# Patient Record
Sex: Female | Born: 1990 | Race: Black or African American | Hispanic: No | Marital: Single | Smoking: Never smoker
Health system: Southern US, Community
[De-identification: ages and names within clinical notes are randomized; demographics above are authoritative.]

---

## 2021-07-29 ENCOUNTER — Emergency Department (HOSPITAL_COMMUNITY)
Admission: EM | Admit: 2021-07-29 | Discharge: 2021-07-30 | Discharge status: Against Medical Advice | Payer: BC Managed Care – PPO | Attending: Emergency Medicine | Admitting: Emergency Medicine

## 2021-07-29 ENCOUNTER — Emergency Department (HOSPITAL_COMMUNITY): Payer: BC Managed Care – PPO

## 2021-07-29 ENCOUNTER — Encounter (HOSPITAL_COMMUNITY): Payer: Self-pay

## 2021-07-29 ENCOUNTER — Other Ambulatory Visit: Payer: Self-pay

## 2021-07-29 DIAGNOSIS — H579 Unspecified disorder of eye and adnexa: Secondary | ICD-10-CM | POA: Insufficient documentation

## 2021-07-29 DIAGNOSIS — Z5321 Procedure and treatment not carried out due to patient leaving prior to being seen by health care provider: Secondary | ICD-10-CM | POA: Diagnosis not present

## 2021-07-29 LAB — BASIC METABOLIC PANEL
Anion gap: 8 (ref 5–15)
BUN: 7 mg/dL (ref 6–20)
CO2: 23 mmol/L (ref 22–32)
Calcium: 9 mg/dL (ref 8.9–10.3)
Chloride: 105 mmol/L (ref 98–111)
Creatinine, Ser: 0.7 mg/dL (ref 0.44–1.00)
GFR, Estimated: >60 mL/min (ref >60)
Glucose, Bld: 88 mg/dL (ref 70–99)
Potassium: 3.3 mmol/L — ABNORMAL LOW (ref 3.5–5.1)
Sodium: 136 mmol/L (ref 135–145)

## 2021-07-29 LAB — CBC WITH DIFFERENTIAL/PLATELET
Abs Immature Granulocytes: 0.02 10*3/uL (ref 0.00–0.07)
Basophils Absolute: 0 10*3/uL (ref 0.0–0.1)
Basophils Relative: 1 %
Eosinophils Absolute: 0.3 10*3/uL (ref 0.0–0.5)
Eosinophils Relative: 5 %
HCT: 30.8 % — ABNORMAL LOW (ref 36.0–46.0)
Hemoglobin: 9 g/dL — ABNORMAL LOW (ref 12.0–15.0)
Immature Granulocytes: 0 %
Lymphocytes Relative: 25 %
Lymphs Abs: 1.5 10*3/uL (ref 0.7–4.0)
MCH: 22.7 pg — ABNORMAL LOW (ref 26.0–34.0)
MCHC: 29.2 g/dL — ABNORMAL LOW (ref 30.0–36.0)
MCV: 77.6 fL — ABNORMAL LOW (ref 80.0–100.0)
Monocytes Absolute: 0.6 10*3/uL (ref 0.1–1.0)
Monocytes Relative: 11 %
Neutro Abs: 3.4 10*3/uL (ref 1.7–7.7)
Neutrophils Relative %: 58 %
Platelets: 368 10*3/uL (ref 150–400)
RBC: 3.97 MIL/uL (ref 3.87–5.11)
RDW: 19.8 % — ABNORMAL HIGH (ref 11.5–15.5)
WBC: 5.9 10*3/uL (ref 4.0–10.5)
nRBC: 0 % (ref 0.0–0.2)

## 2021-07-29 IMAGING — MR MR HEAD WO/W CM
13 series · 48 of 48 positions shown · IV contrast (gadavist)
Comparison: None.

CLINICAL DATA: Ocular pain

EXAM:
MRI HEAD WITHOUT AND WITH CONTRAST
TECHNIQUE: Multiplanar, multiecho pulse sequences of the brain and surrounding
structures were obtained without and with intravenous contrast.
CONTRAST:  9mL GADAVIST GADOBUTROL 1 MMOL/ML IV SOLN

[Series 5: DWI · axial · 3.0mm · 1.36mm/px · z∈[-72,+71]mm · 7 of 100 slices shown (1 of 2)]
[im 1/100]
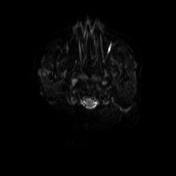
[im 17/100]
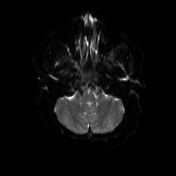
[im 34/100]
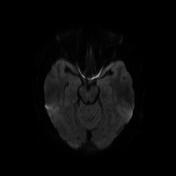
[im 50/100]
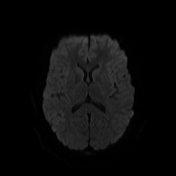
[im 67/100]
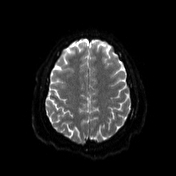
[im 83/100]
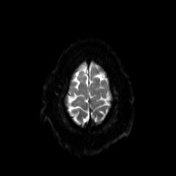
[im 100/100]
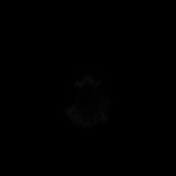

[Series 6: DWI · axial · 3.0mm · 1.36mm/px · z∈[-72,+71]mm · 3 of 50 slices shown (2 of 2)]
[im 1/50]
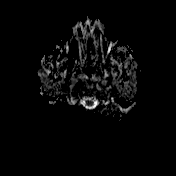
[im 25/50]
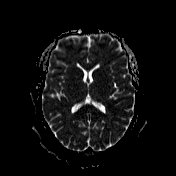
[im 50/50]
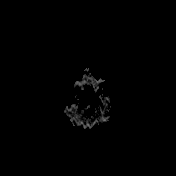

[Series 7: T1 · sagittal · 5.0mm · 0.75mm/px · 2 of 24 slices shown (1 of 2)]
[im 1/24]
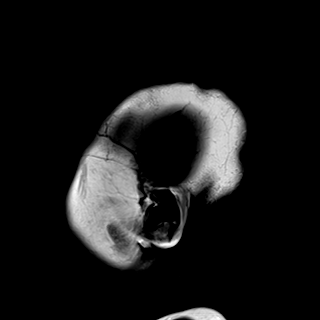
[im 24/24]
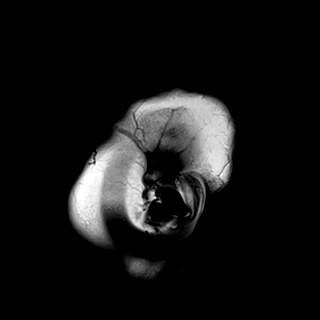

[Series 8: T2 · axial · 5.0mm · 0.62mm/px · 1 of 23 slices shown]
[im 1/23]
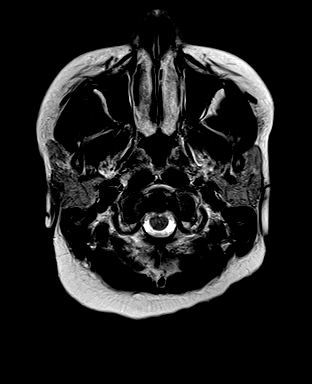

[Series 9: swi_images · axial · 3.0mm · 0.75mm/px · z∈[-69,+68]mm · 3 of 48 slices shown]
[im 1/48]
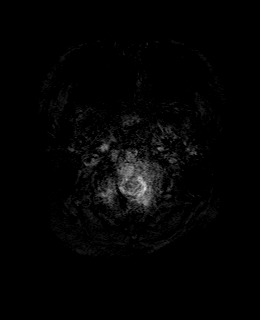
[im 24/48]
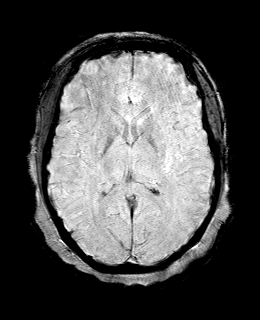
[im 48/48]
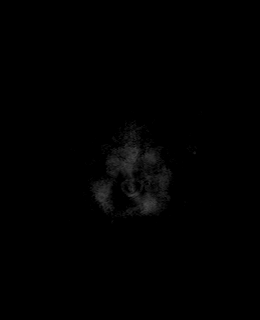

[Series 11: FLAIR · axial · 3.0mm · 0.75mm/px · z∈[-71,+70]mm · 3 of 49 slices shown]
[im 1/49]
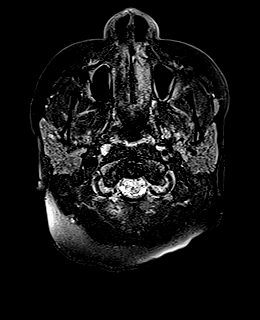
[im 25/49]
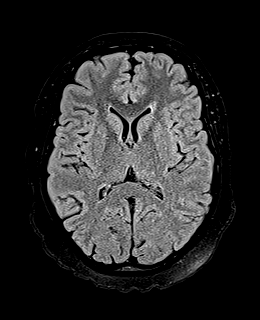
[im 49/49]
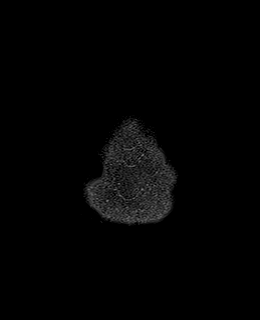

[Series 12: T1 · axial · 1.0mm · 0.94mm/px · z∈[-71,+68]mm · 9 of 144 slices shown (2 of 2)]
[im 1/144]
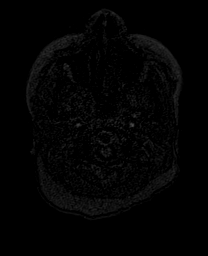
[im 18/144]
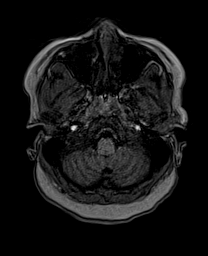
[im 36/144]
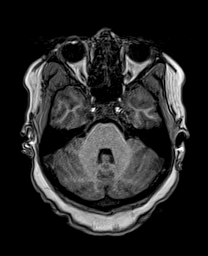
[im 54/144]
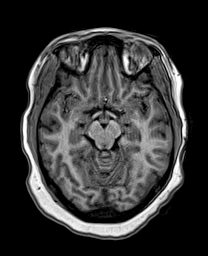
[im 72/144]
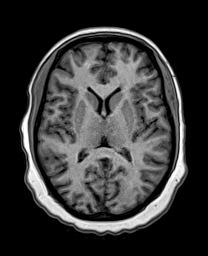
[im 90/144]
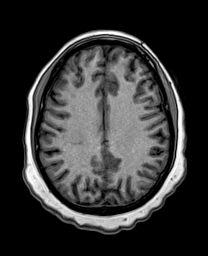
[im 108/144]
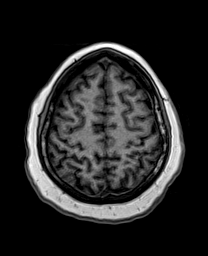
[im 126/144]
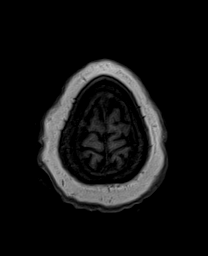
[im 144/144]
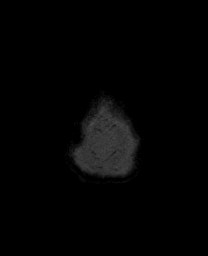

[Series 13: cor dwi_tracew · coronal · 5.0mm · 1.53mm/px · 3 of 54 slices shown]
[im 1/54]
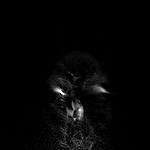
[im 27/54]
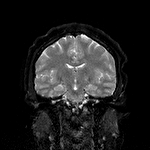
[im 54/54]
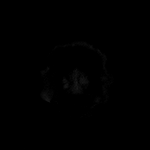

[Series 14: cor dwi_adc · coronal · 5.0mm · 1.53mm/px · 2 of 27 slices shown]
[im 1/27]
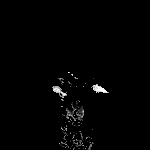
[im 27/27]
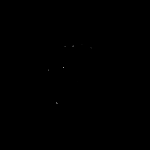

[Series 15: T2 post-contrast · coronal · 5.0mm · 0.69mm/px · 2 of 27 slices shown]
[im 1/27]
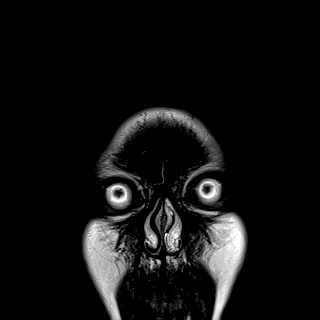
[im 27/27]
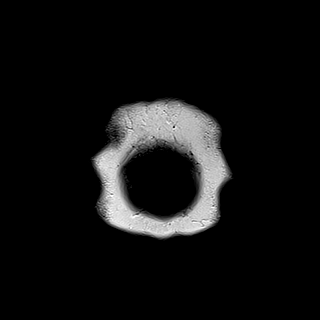

[Series 16: T1 post-contrast · axial · 1.0mm · 0.94mm/px · z∈[-71,+68]mm · 9 of 144 slices shown (1 of 3)]
[im 1/144]
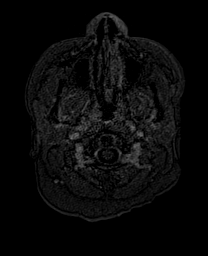
[im 18/144]
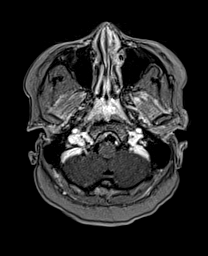
[im 36/144]
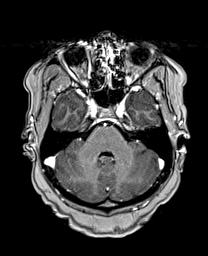
[im 54/144]
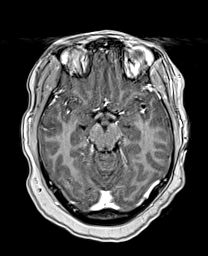
[im 72/144]
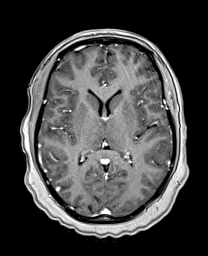
[im 90/144]
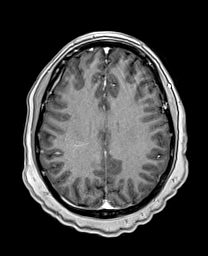
[im 108/144]
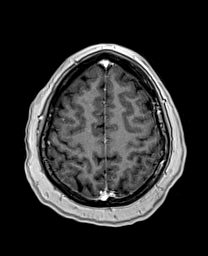
[im 126/144]
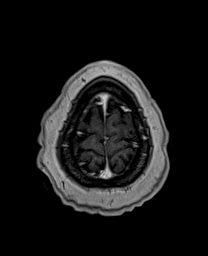
[im 144/144]
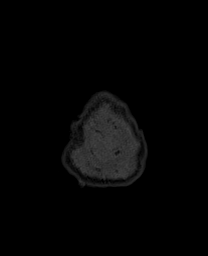

[Series 17: T1 post-contrast · coronal · 5.0mm · 0.43mm/px · 2 of 27 slices shown (2 of 3)]
[im 1/27]
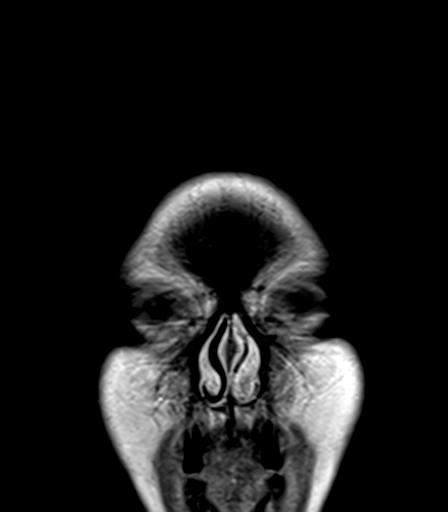
[im 27/27]
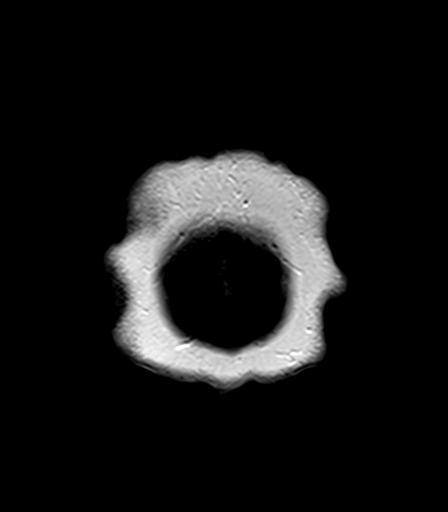

[Series 18: T1 post-contrast · sagittal · 5.0mm · 0.75mm/px · 2 of 24 slices shown (3 of 3)]
[im 1/24]
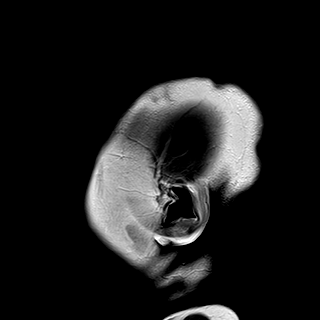
[im 24/24]
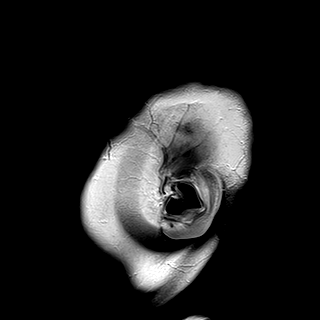

[48 of 48 positions shown; findings below may reference images not displayed]

FINDINGS: Brain: No acute infarct, mass effect or extra-axial collection. No
acute or chronic hemorrhage. Normal white matter signal, parenchymal
volume and CSF spaces. The midline structures are normal. There is
no abnormal contrast enhancement.

Vascular: Major flow voids are preserved.

Skull and upper cervical spine: Normal calvarium and skull base.
Visualized upper cervical spine and soft tissues are normal.

Sinuses/Orbits:No paranasal sinus fluid levels or advanced mucosal
thickening. No mastoid or middle ear effusion. Normal orbits.
IMPRESSION: Normal brain MRI.

## 2021-07-29 IMAGING — MR MR MRV HEAD WO/W CM
2 of 4 series · 30 of 48 positions shown · IV contrast (gadavist)
Comparison: No pertinent prior exam.

CLINICAL DATA: Optic disc edema

EXAM:
MR VENOGRAM HEAD WITHOUT AND WITH CONTRAST
TECHNIQUE: Angiographic images of the intracranial venous structures were
acquired using MRV technique without and with intravenous contrast.
CONTRAST:  9mL GADAVIST GADOBUTROL 1 MMOL/ML IV SOLN

[Series 4: cor 2d · coronal · 2.5mm · 0.69mm/px · 12 of 106 slices shown]
[im 1/106]
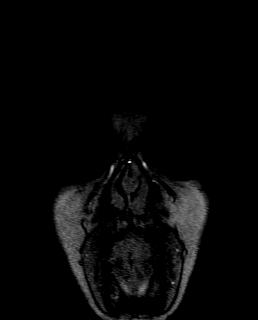
[im 10/106]
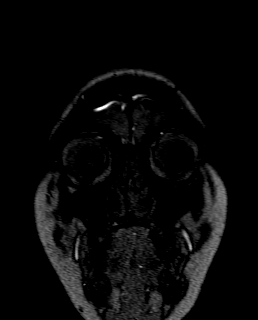
[im 20/106]
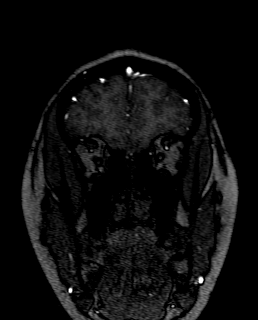
[im 29/106]
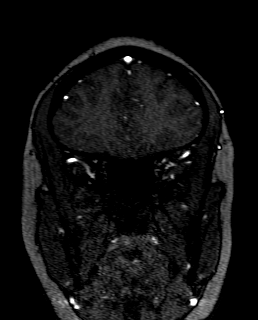
[im 39/106]
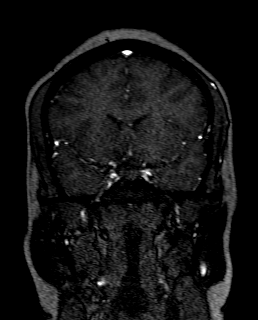
[im 48/106]
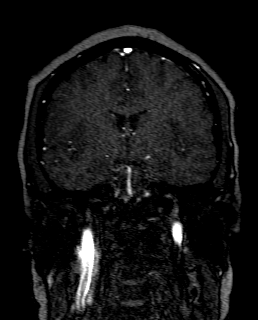
[im 58/106]
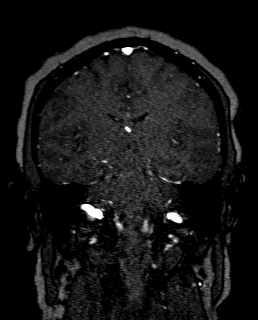
[im 67/106]
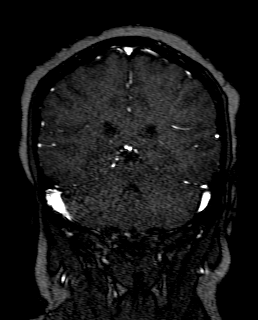
[im 77/106]
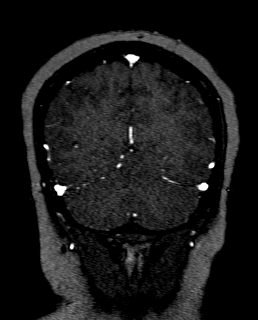
[im 86/106]
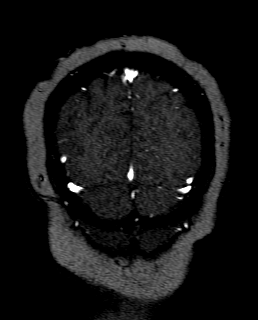
[im 96/106]
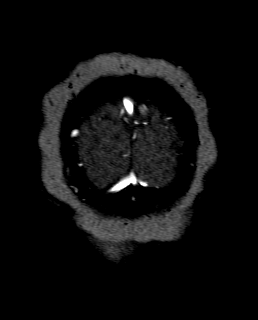
[im 106/106]
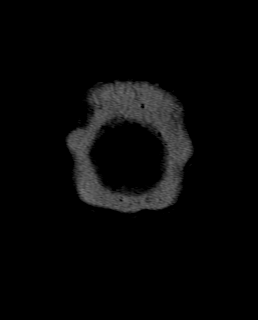

[Series 11: T1 post-contrast · sagittal · 1.0mm · 0.94mm/px · 18 of 144 slices shown]
[im 1/144]
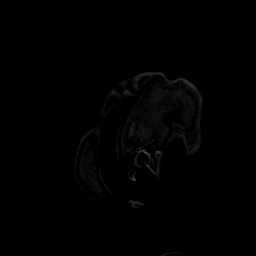
[im 9/144]
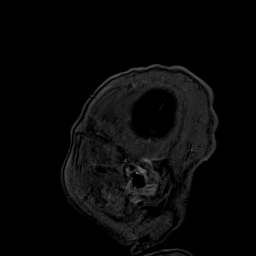
[im 17/144]
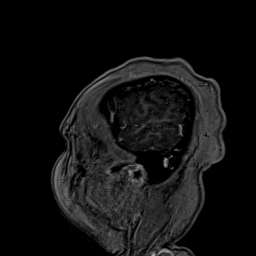
[im 26/144]
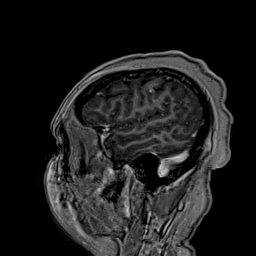
[im 34/144]
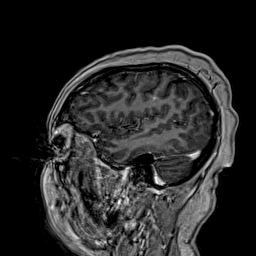
[im 43/144]
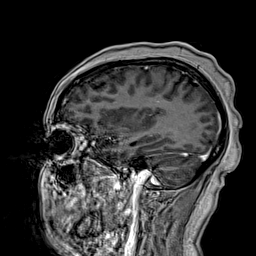
[im 51/144]
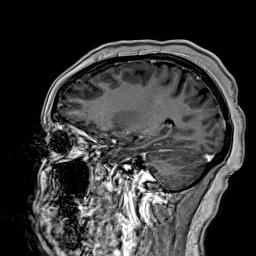
[im 59/144]
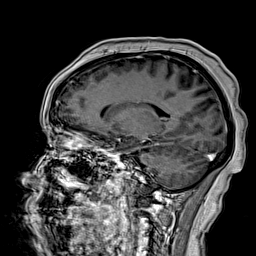
[im 68/144]
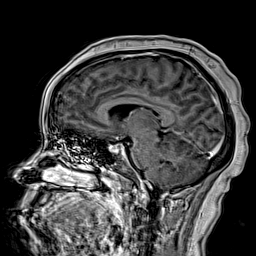
[im 76/144]
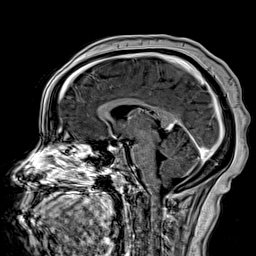
[im 85/144]
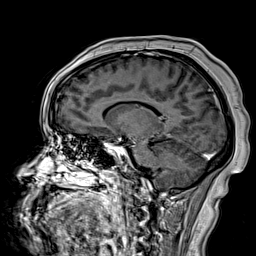
[im 93/144]
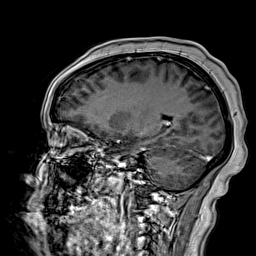
[im 101/144]
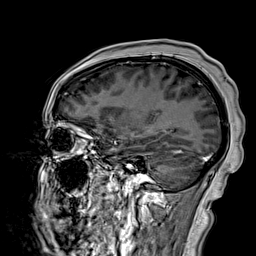
[im 110/144]
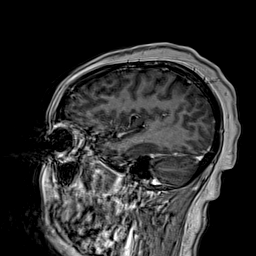
[im 118/144]
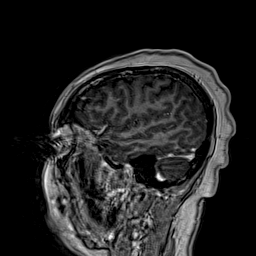
[im 127/144]
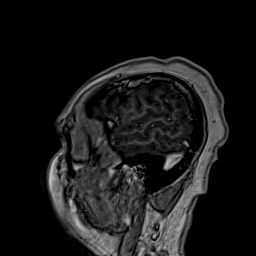
[im 135/144]
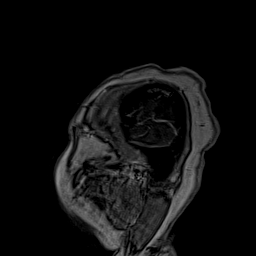
[im 144/144]
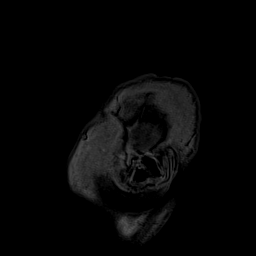

[30 of 48 positions shown; findings below may reference images not displayed]

FINDINGS: Superior sagittal sinus: Normal.

Straight sinus: Normal.

Inferior sagittal sinus, vein of ANTON and internal cerebral veins:
Normal.

Transverse sinuses: Normal.

Sigmoid sinuses: Normal.

Visualized jugular veins: Normal.
IMPRESSION: Normal intracranial MRV.

## 2021-07-29 IMAGING — MR MR ORBITS WO/W CM
7 series · 48 of 48 positions shown · IV contrast (gadavist)
Comparison: No pertinent prior exam.

CLINICAL DATA: Ocular pain

EXAM:
MRI OF THE ORBITS WITHOUT AND WITH CONTRAST
TECHNIQUE: Multiplanar, multi-echo pulse sequences of the orbits and
surrounding structures were acquired including fat saturation
techniques, before and after intravenous contrast administration.
CONTRAST:  9mL GADAVIST GADOBUTROL 1 MMOL/ML IV SOLN

[Series 1: T2 fat-sat · axial · 3.0mm · 0.47mm/px · z∈[-63,-3]mm · 6 of 20 slices shown (1 of 3)]
[im 1/20]
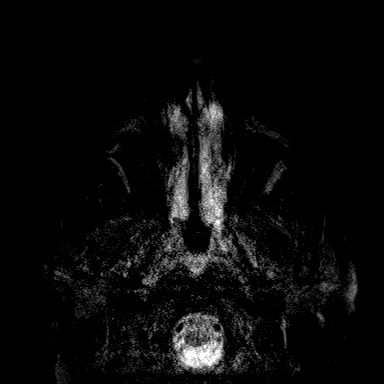
[im 4/20]
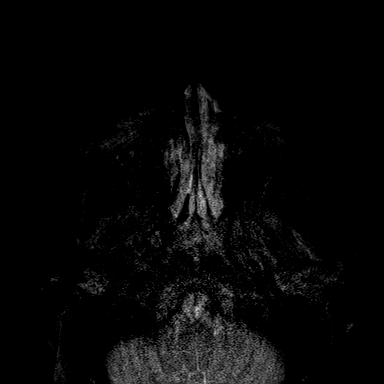
[im 8/20]
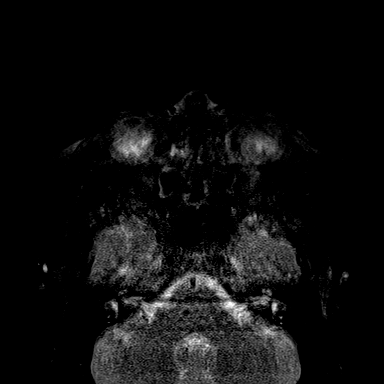
[im 12/20]
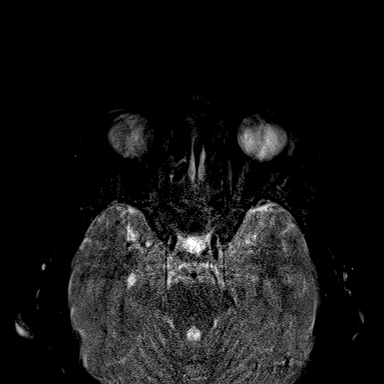
[im 16/20]
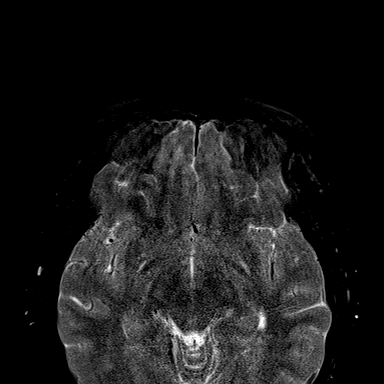
[im 20/20]
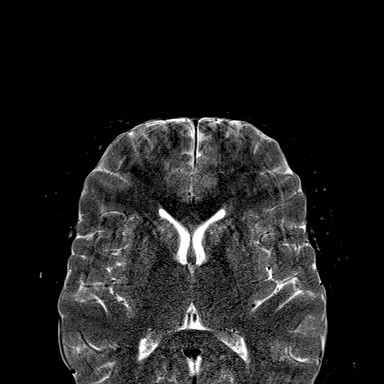

[Series 2: T1 · axial · 3.0mm · 0.56mm/px · z∈[-63,-3]mm · 6 of 20 slices shown (1 of 2)]
[im 1/20]
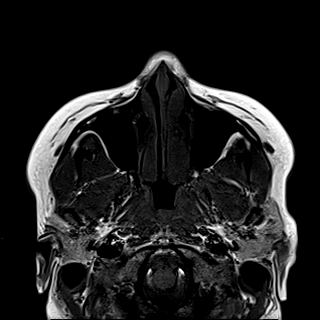
[im 4/20]
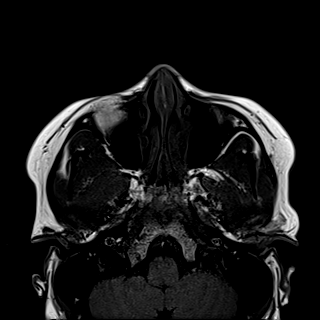
[im 8/20]
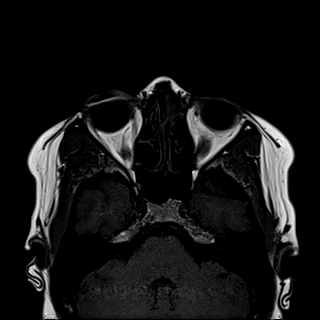
[im 12/20]
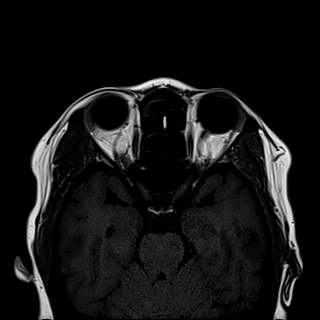
[im 16/20]
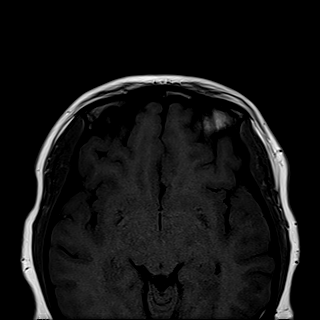
[im 20/20]
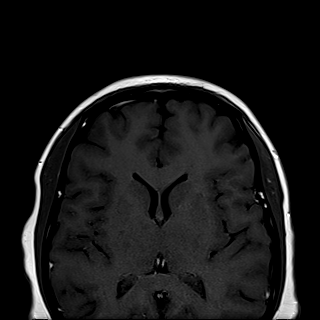

[Series 3: T2 fat-sat · coronal · 3.0mm · 0.56mm/px · 8 of 28 slices shown (2 of 3)]
[im 1/28]
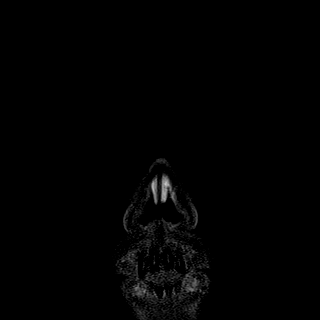
[im 4/28]
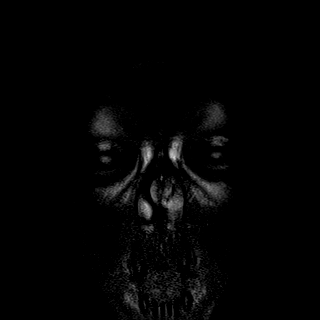
[im 8/28]
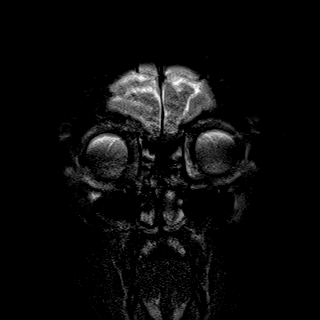
[im 12/28]
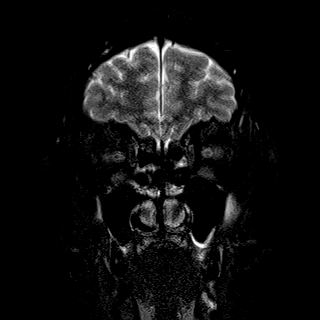
[im 16/28]
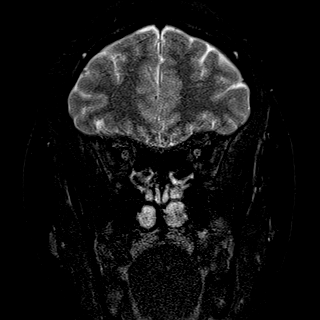
[im 20/28]
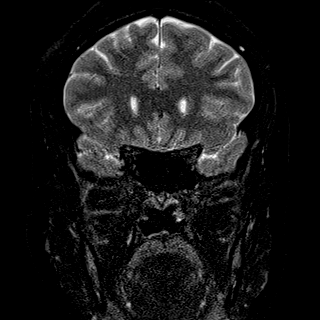
[im 24/28]
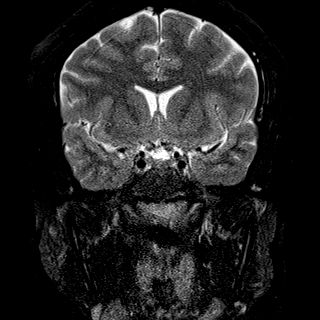
[im 28/28]
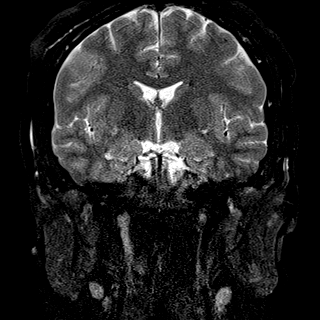

[Series 4: T1 · coronal · 3.0mm · 0.70mm/px · 8 of 28 slices shown (2 of 2)]
[im 1/28]
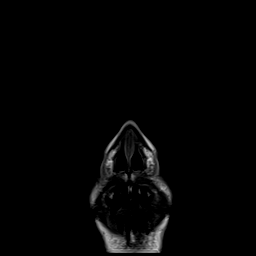
[im 4/28]
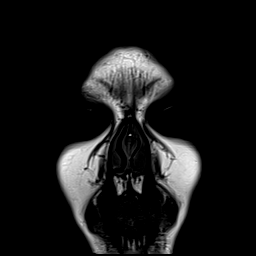
[im 8/28]
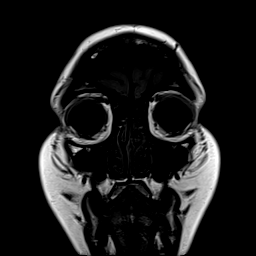
[im 12/28]
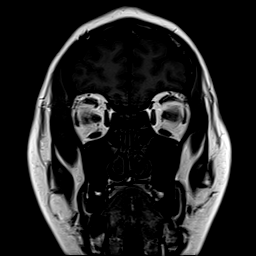
[im 16/28]
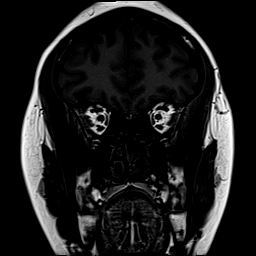
[im 20/28]
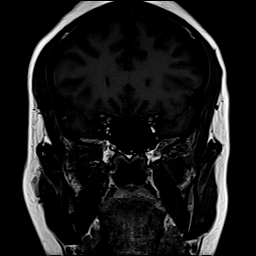
[im 24/28]
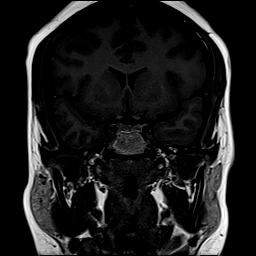
[im 28/28]
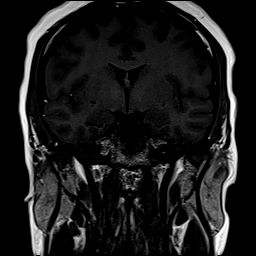

[Series 5: T2 fat-sat · axial · 3.0mm · 0.47mm/px · z∈[-63,-3]mm · 6 of 20 slices shown (3 of 3)]
[im 1/20]
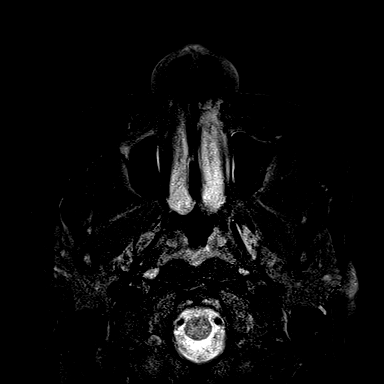
[im 4/20]
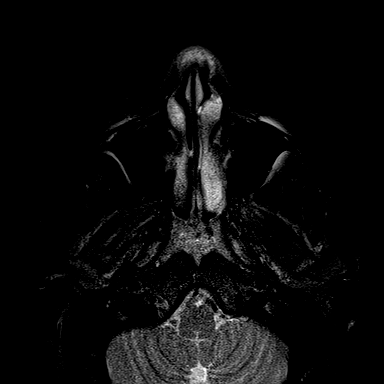
[im 8/20]
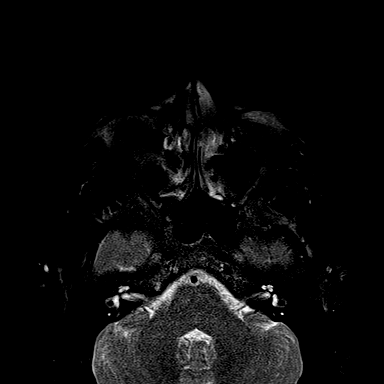
[im 12/20]
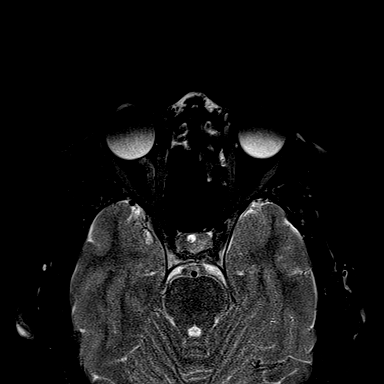
[im 16/20]
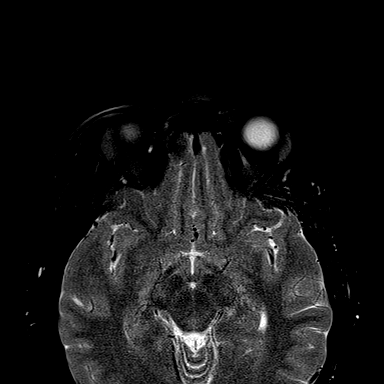
[im 20/20]
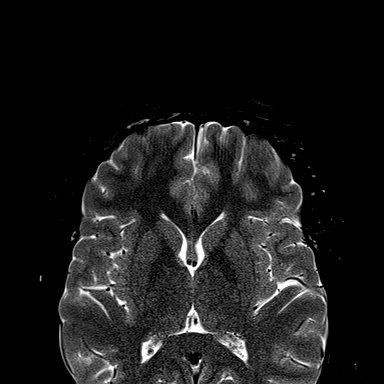

[Series 6: T1 fat-sat post-contrast · axial · 3.0mm · 0.70mm/px · z∈[-63,-3]mm · 6 of 20 slices shown (1 of 2)]
[im 1/20]
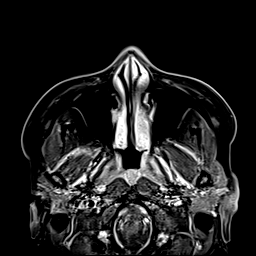
[im 4/20]
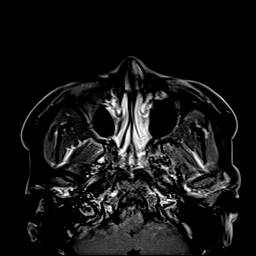
[im 8/20]
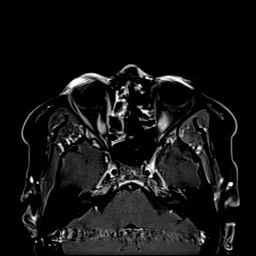
[im 12/20]
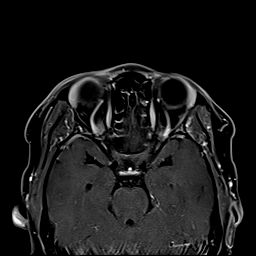
[im 16/20]
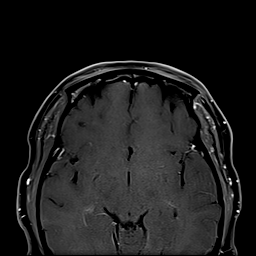
[im 20/20]
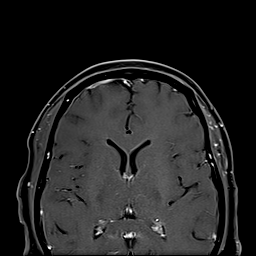

[Series 7: T1 fat-sat post-contrast · coronal · 3.0mm · 0.94mm/px · 8 of 28 slices shown (2 of 2)]
[im 1/28]
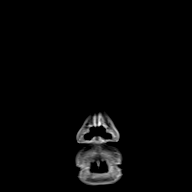
[im 4/28]
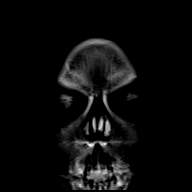
[im 8/28]
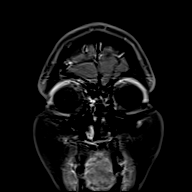
[im 12/28]
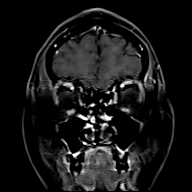
[im 16/28]
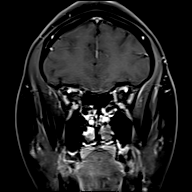
[im 20/28]
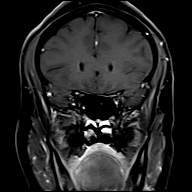
[im 24/28]
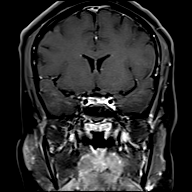
[im 28/28]
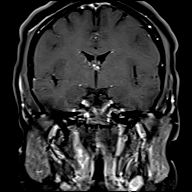

[48 of 48 positions shown; findings below may reference images not displayed]

FINDINGS: Orbits:

--Globes: Normal.

--Bony orbit: Normal.

--Preseptal soft tissues: Normal.

--Intra- and extraconal orbital fat: Normal. No inflammatory
stranding.

--Optic nerves: Normal.

--Lacrimal glands and fossae: Normal.

--Extraocular muscles: Normal.

Visualized sinuses:  No fluid levels or advanced mucosal thickening.

Soft tissues: Normal.

Limited intracranial: Normal.
IMPRESSION: Normal CT of the orbits.

## 2021-07-29 MED ORDER — GADOBUTROL 1 MMOL/ML IV SOLN
9.0000 mL | Freq: Once | INTRAVENOUS | Status: AC | PRN
  Administered 2021-07-29: 9 mL via INTRAVENOUS

## 2021-07-29 NOTE — ED Notes (Signed)
Attempted IV for MRI w/ contrast, unsuccessful.

## 2021-07-29 NOTE — ED Provider Triage Note (Signed)
Emergency Medicine Provider Triage Evaluation Note  Jenna Waters , a 31 y.o. female  was evaluated in triage.  Pt presents from the ophthalmologist for MRI brain, orbits, and MRV.  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 Sent to the ED for further evaluation. No focal weakness or numbness. No headaches. No visual deficits.   Review of Systems  Positive:  Negative: See above   Physical Exam  BP (!) 142/77 (BP Location: Left Arm)    Pulse 80    Temp 98.4 F (36.9 C) (Oral)    Resp 16    Ht 5\' 4"  (1.626 m)    Wt 88.5 kg    LMP 07/21/2021 (Exact Date)    SpO2 100%    BMI 33.47 kg/m  Gen:   Awake, no distress   Resp:  Normal effort  MSK:   Moves extremities without difficulty  Other:    Medical Decision Making  Medically screening exam initiated at 6:10 PM.  Appropriate orders placed.  Jenna Waters was informed that the remainder of the evaluation will be completed by another provider, this initial triage assessment does not replace that evaluation, and the importance of remaining in the ED until their evaluation is complete.     Almyra Free [place], [place] 07/29/21 1812

## 2021-07-29 NOTE — ED Triage Notes (Signed)
Patient reports that she saw an eye specialist today and was told to come to the ED to r/o a clot. Patient's papers that she brought suggested an MRI. Patient denies any eye pain or blurred vision.

## 2021-07-30 NOTE — ED Notes (Signed)
Pt told registration staff that she was leaving.  °

## 2021-08-05 ENCOUNTER — Ambulatory Visit: Payer: BC Managed Care – PPO | Admitting: Neurology
